# Patient Record
Sex: Male | Born: 1970
Health system: Southern US, Community
[De-identification: ages and names within clinical notes are randomized; demographics above are authoritative.]

## PROBLEM LIST (undated history)

## (undated) DIAGNOSIS — G4733 Obstructive sleep apnea (adult) (pediatric): Secondary | ICD-10-CM

## (undated) HISTORY — DX: Obstructive sleep apnea (adult) (pediatric): G47.33

## (undated) HISTORY — DX: Morbid (severe) obesity due to excess calories: E66.01

---

## 2009-02-22 ENCOUNTER — Ambulatory Visit (HOSPITAL_COMMUNITY): Admission: RE | Admit: 2009-02-22 | Discharge: 2009-02-23 | Payer: Self-pay | Admitting: Otolaryngology

## 2009-02-22 ENCOUNTER — Encounter (INDEPENDENT_AMBULATORY_CARE_PROVIDER_SITE_OTHER): Payer: Self-pay | Admitting: Otolaryngology

## 2010-11-20 IMAGING — CR DG CHEST 2V
2 series · 2 of 2 positions shown · non-contrast
Comparison: None

CLINICAL DATA: Chronic tonsillitis.  Preadmit chest x-ray.

CHEST - 2 VIEW

[view not recorded (1 of 2)]
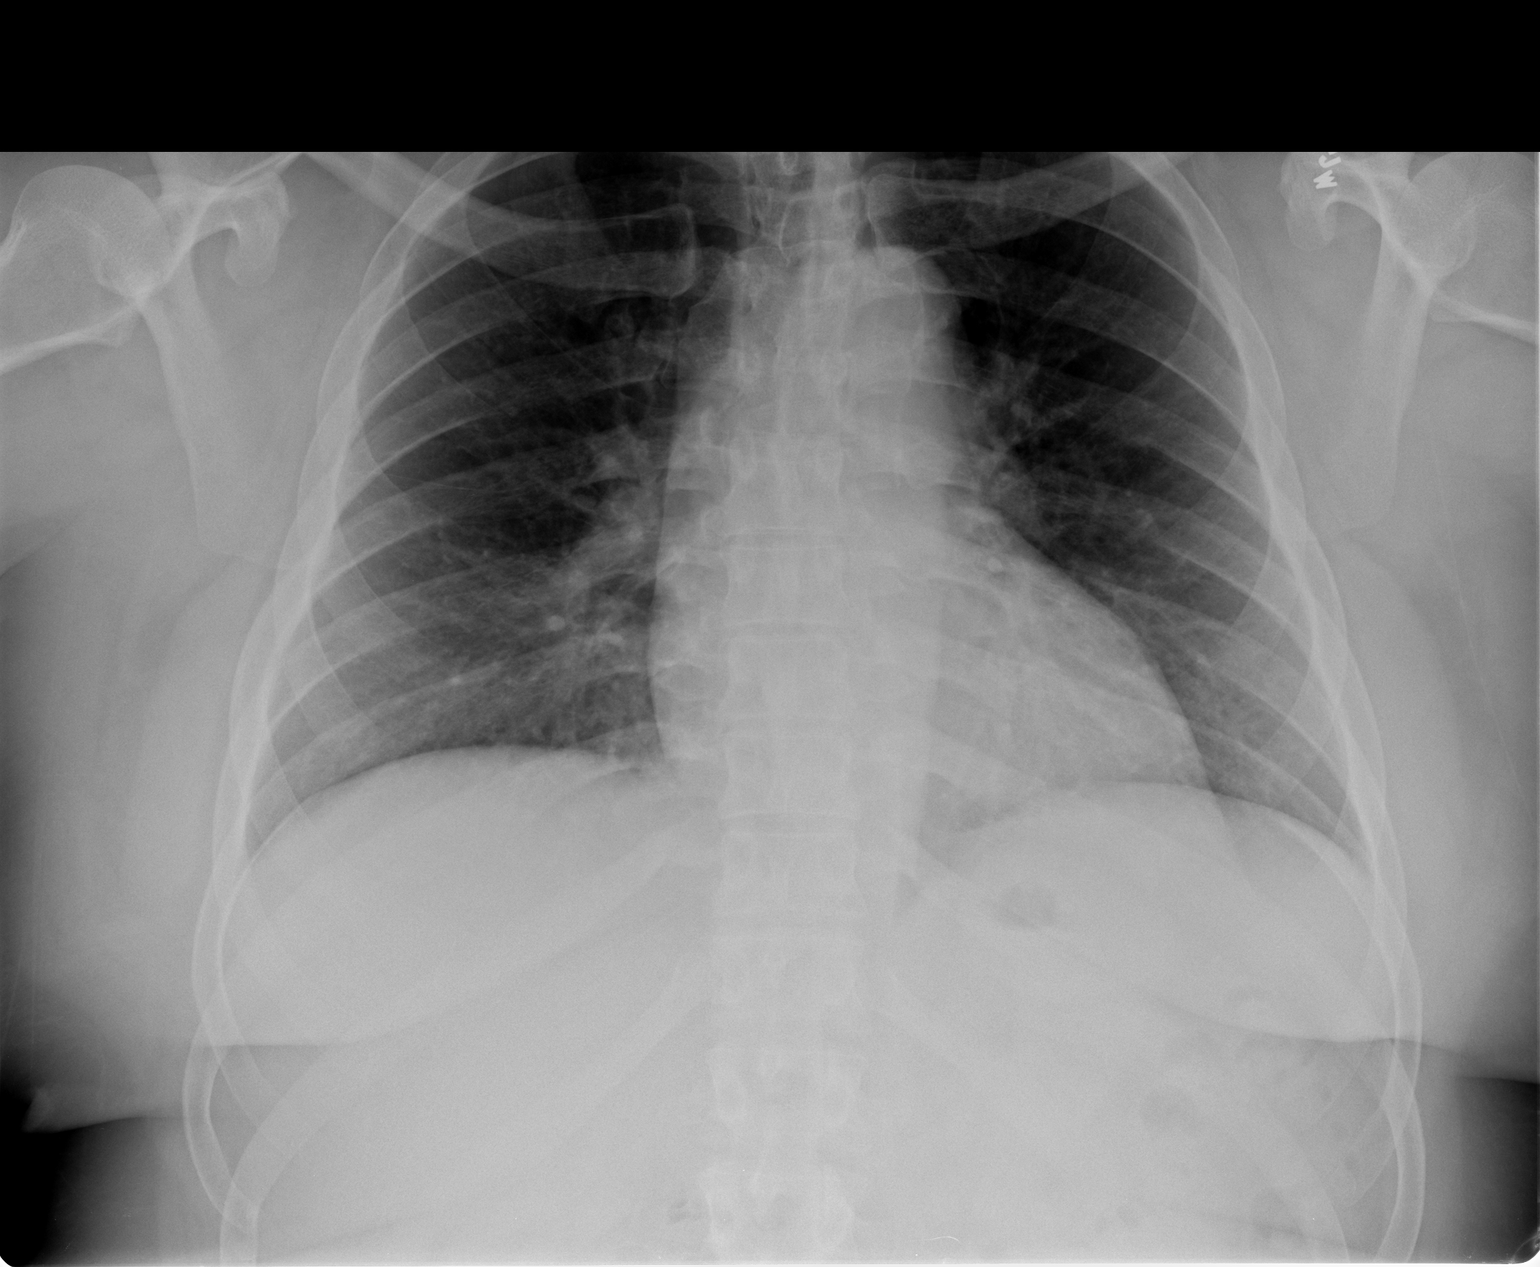

[view not recorded (2 of 2)]
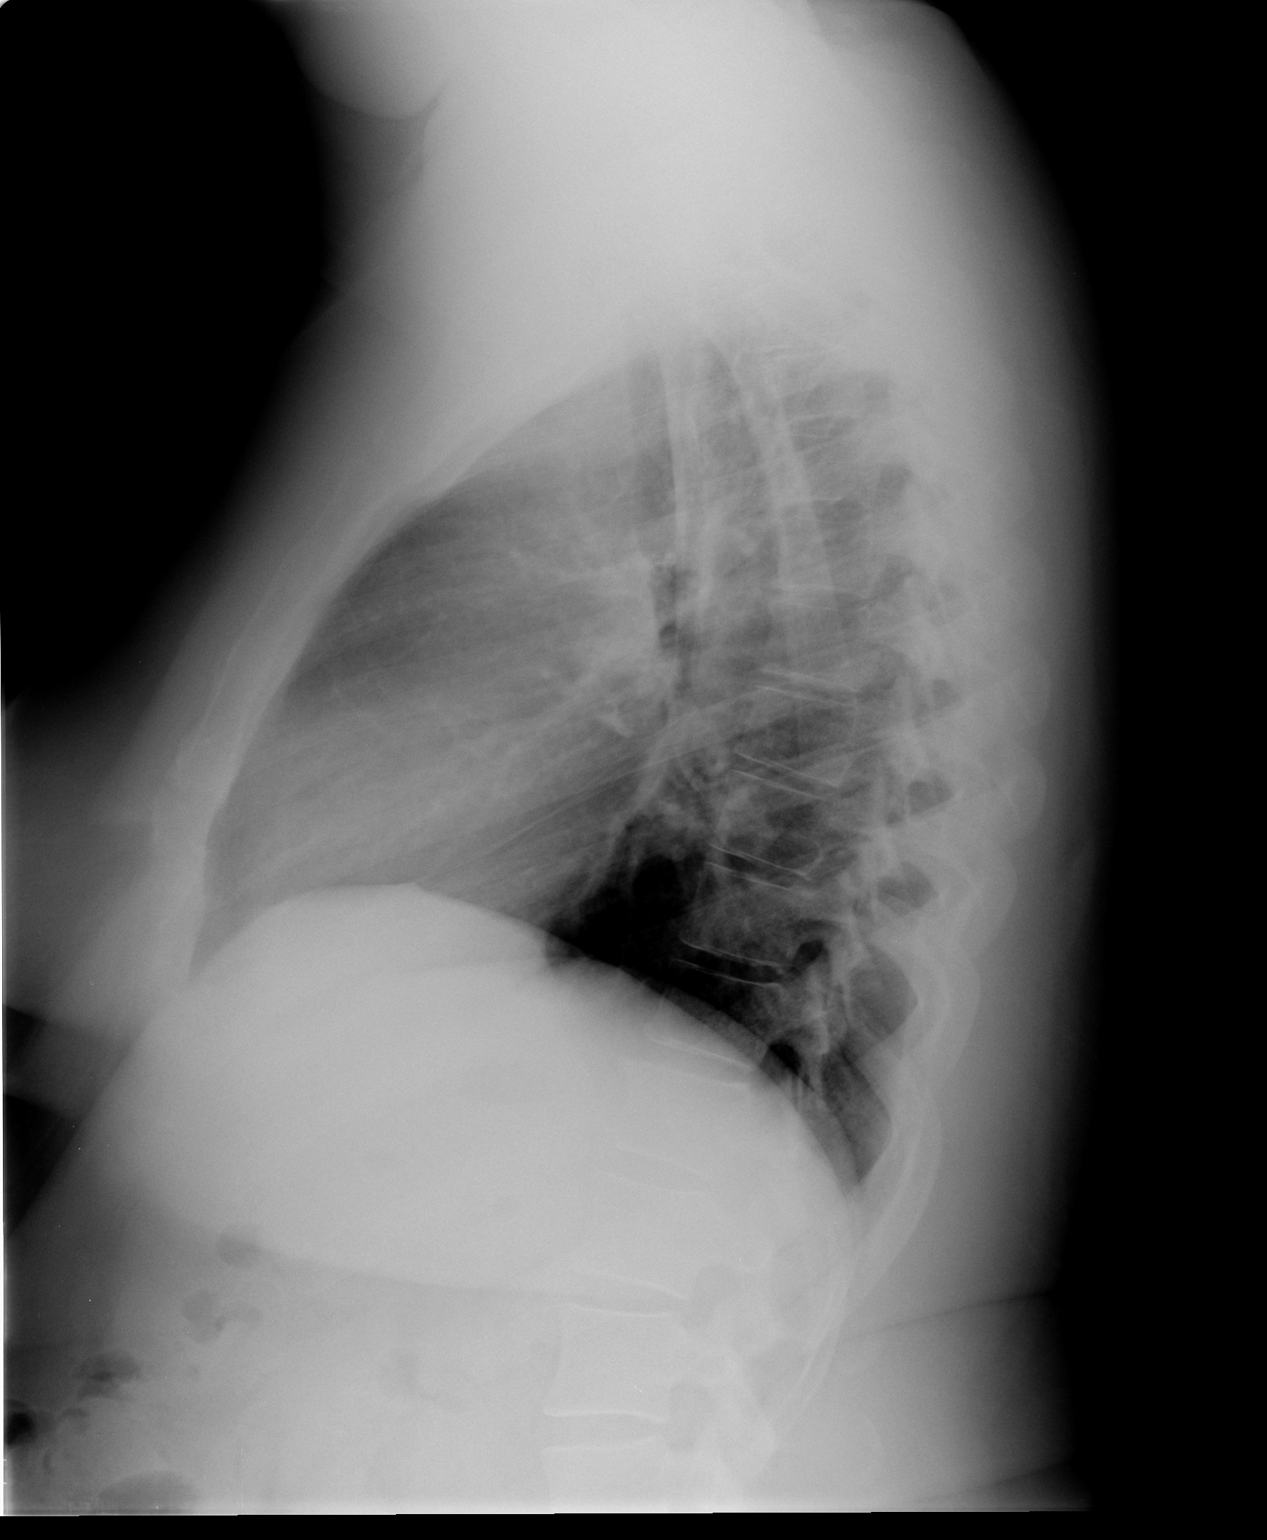

[2 of 2 positions shown; findings below may reference images not displayed]

FINDINGS: Heart size is normal and the vascularity is normal.  The
lungs are clear and  there is no infiltrate or effusion.  Negative
for mass lesion.
IMPRESSION: No active cardiopulmonary disease.

## 2011-01-25 ENCOUNTER — Inpatient Hospital Stay (INDEPENDENT_AMBULATORY_CARE_PROVIDER_SITE_OTHER)
Admission: RE | Admit: 2011-01-25 | Discharge: 2011-01-25 | Disposition: A | Discharge status: Home / Self Care | Payer: Medicare FFS | Source: Ambulatory Visit | Attending: Family Medicine | Admitting: Family Medicine

## 2011-01-25 DIAGNOSIS — I10 Essential (primary) hypertension: Secondary | ICD-10-CM

## 2011-01-25 DIAGNOSIS — J069 Acute upper respiratory infection, unspecified: Secondary | ICD-10-CM

## 2011-01-25 LAB — POCT RAPID STREP A (OFFICE): Streptococcus, Group A Screen (Direct): NEGATIVE

## 2011-03-28 LAB — HEPATIC FUNCTION PANEL
ALT: 16 U/L (ref 0–53)
AST: 20 U/L (ref 0–37)
Alkaline Phosphatase: 47 U/L (ref 39–117)
Total Protein: 6.9 g/dL (ref 6.0–8.3)

## 2011-03-28 LAB — BASIC METABOLIC PANEL
BUN: 7 mg/dL (ref 6–23)
Chloride: 105 mEq/L (ref 96–112)
Creatinine, Ser: 0.79 mg/dL (ref 0.4–1.5)
GFR calc Af Amer: >60 mL/min (ref >60)
GFR calc non Af Amer: >60 mL/min (ref >60)
Potassium: 4.2 mEq/L (ref 3.5–5.1)

## 2011-03-28 LAB — CBC
HCT: 42.8 % (ref 39.0–52.0)
MCV: 79.3 fL (ref 78.0–100.0)
Platelets: 194 10*3/uL (ref 150–400)
RBC: 5.4 MIL/uL (ref 4.22–5.81)
WBC: 5.5 10*3/uL (ref 4.0–10.5)

## 2011-04-30 NOTE — Op Note (Signed)
Ronnie Holt, Ronnie Holt                ACCOUNT NO.:  [id number]   MEDICAL RECORD NO.:  [id number]          PATIENT TYPE:  AMB   LOCATION:  SDS                          FACILITY:  [facility]   PHYSICIAN:  Antony Contras, MD     DATE OF BIRTH:  23-Jun-1971   DATE OF PROCEDURE:  DATE OF DISCHARGE:                               OPERATIVE REPORT   PREOPERATIVE DIAGNOSES:  1. Chronic tonsillitis.  2. Tonsillar hypertrophy.   POSTOPERATIVE DIAGNOSES:  1. Chronic tonsillitis.  2. Tonsillar hypertrophy.   PROCEDURE:  Tonsillectomy.   SURGEON:  Antony Contras, MD.   ANESTHESIA:  General endotracheal anesthesia.   COMPLICATIONS:  None.   INDICATION:  The patient is a 40 year old African American male with a 2-  month history of recurring tonsil infections, totaling 3 times.  His  throat gets clogged with infections and it is hard to breath.  He snores  badly, but has had no witnessed apnea.  He is found to have 4+ tonsils  and presents to the operating room for surgical management.   FINDINGS:  As above.   DESCRIPTION OF THE PROCEDURE:  The patient was identified in the holding  room and informed consent having been obtained including the discussion  of risks, benefits, and alternatives, the patient was brought to the  operative suite and put on the operative table in supine position.  Anesthesia was induced.  The patient was intubated by the anesthesia  team with fair degree of difficulty using a GlideScope.  The patient was  given intravenous antibiotics and steroids during the case.  The eyes  were taped closed and the bed was turned 90 degrees from anesthesia.  A  head wrap was placed around the patient's head and a Crowe-Davis  retractor was inserted in the mouth and opened to reveal the oropharynx.  The mouth was small and the retractor could not be opened very far.  The  retractor was placed in suspension on the Mayo stand.  There was a small  laceration on the right side at the  location where peritonsillar abscess  would be drained having been caused by the intubation.  The right tonsil  was grasped with a straight Allis and retracted medially while the  curvilinear incision was made along the anterior tonsillar pillar using  Bovie electrocautery in the setting of 20 connecting with the superior  pole laceration.  Dissection continued in the subcapsular plane until  the tonsil was removed.  Two tonsil packs were placed in the right  fossa.  The same procedure was then carried out on the left side.  Tonsils were passed separate for pathology.  Bleeding was then  controlled with suction cautery on a setting of 30.  After this, the  nose and throat were copiously irrigated with saline and a flexible  catheter was passed down the esophagus to suck out the stomach and  esophagus.  Retractor was then taken out of suspension and removed from the  patient's mouth.  He was returned back to anesthesia for wake up and was  extubated, and moved to  the recovery room in stable condition.   Of note, during the case, there were 2 episodes where the endotracheal  tube had to be suctioned of red, frothy fluid.      Antony Contras, MD  Electronically Signed     DDB/MEDQ  D:  02/22/2009  T:  02/23/2009  Job:  [id number]

## 2015-02-21 ENCOUNTER — Other Ambulatory Visit: Payer: Self-pay | Admitting: Gastroenterology

## 2017-03-10 DIAGNOSIS — Z0000 Encounter for general adult medical examination without abnormal findings: Secondary | ICD-10-CM | POA: Diagnosis not present

## 2017-03-13 DIAGNOSIS — E785 Hyperlipidemia, unspecified: Secondary | ICD-10-CM | POA: Diagnosis not present

## 2017-03-13 DIAGNOSIS — Z0000 Encounter for general adult medical examination without abnormal findings: Secondary | ICD-10-CM | POA: Diagnosis not present

## 2017-03-13 DIAGNOSIS — Z125 Encounter for screening for malignant neoplasm of prostate: Secondary | ICD-10-CM | POA: Diagnosis not present

## 2017-06-12 DIAGNOSIS — G4733 Obstructive sleep apnea (adult) (pediatric): Secondary | ICD-10-CM | POA: Diagnosis not present

## 2018-03-12 DIAGNOSIS — Z0000 Encounter for general adult medical examination without abnormal findings: Secondary | ICD-10-CM | POA: Diagnosis not present

## 2018-03-13 DIAGNOSIS — E785 Hyperlipidemia, unspecified: Secondary | ICD-10-CM | POA: Diagnosis not present

## 2018-03-13 DIAGNOSIS — Z125 Encounter for screening for malignant neoplasm of prostate: Secondary | ICD-10-CM | POA: Diagnosis not present

## 2018-03-26 DIAGNOSIS — S46912A Strain of unspecified muscle, fascia and tendon at shoulder and upper arm level, left arm, initial encounter: Secondary | ICD-10-CM | POA: Diagnosis not present

## 2018-06-09 DIAGNOSIS — Z86010 Personal history of colonic polyps: Secondary | ICD-10-CM | POA: Diagnosis not present

## 2018-06-09 DIAGNOSIS — D126 Benign neoplasm of colon, unspecified: Secondary | ICD-10-CM | POA: Diagnosis not present

## 2018-06-09 DIAGNOSIS — K5730 Diverticulosis of large intestine without perforation or abscess without bleeding: Secondary | ICD-10-CM | POA: Diagnosis not present

## 2018-06-24 DIAGNOSIS — G4733 Obstructive sleep apnea (adult) (pediatric): Secondary | ICD-10-CM | POA: Diagnosis not present

## 2019-03-29 DIAGNOSIS — G4733 Obstructive sleep apnea (adult) (pediatric): Secondary | ICD-10-CM | POA: Diagnosis not present

## 2020-02-28 ENCOUNTER — Ambulatory Visit: Payer: 59 | Attending: Internal Medicine

## 2020-02-28 DIAGNOSIS — Z20822 Contact with and (suspected) exposure to covid-19: Secondary | ICD-10-CM

## 2020-02-29 ENCOUNTER — Encounter: Payer: Self-pay | Admitting: *Deleted

## 2020-02-29 LAB — NOVEL CORONAVIRUS, NAA: SARS-CoV-2, NAA: DETECTED — AB

## 2020-03-02 ENCOUNTER — Ambulatory Visit (HOSPITAL_COMMUNITY)
Admission: RE | Admit: 2020-03-02 | Discharge: 2020-03-02 | Disposition: A | Discharge status: Home / Self Care | Payer: 59 | Source: Ambulatory Visit | Attending: Pulmonary Disease | Admitting: Pulmonary Disease

## 2020-03-02 ENCOUNTER — Encounter: Payer: Self-pay | Admitting: Physician Assistant

## 2020-03-02 ENCOUNTER — Other Ambulatory Visit: Payer: Self-pay | Admitting: Physician Assistant

## 2020-03-02 DIAGNOSIS — U071 COVID-19: Secondary | ICD-10-CM

## 2020-03-02 MED ORDER — SODIUM CHLORIDE 0.9 % IV SOLN
700.0000 mg | Freq: Once | INTRAVENOUS | Status: AC
  Administered 2020-03-02: 700 mg via INTRAVENOUS
  Filled 2020-03-02: qty 700

## 2020-03-02 MED ORDER — SODIUM CHLORIDE 0.9 % IV SOLN
INTRAVENOUS | Status: DC | PRN
  Administered 2020-03-02: 250 mL via INTRAVENOUS

## 2020-03-02 MED ORDER — EPINEPHRINE 0.3 MG/0.3ML IJ SOAJ: 0.3000 mg | Freq: Once | INTRAMUSCULAR | Status: DC | PRN

## 2020-03-02 MED ORDER — ALBUTEROL SULFATE HFA 108 (90 BASE) MCG/ACT IN AERS: 2.0000 | INHALATION_SPRAY | Freq: Once | RESPIRATORY_TRACT | Status: DC | PRN

## 2020-03-02 MED ORDER — FAMOTIDINE IN NACL 20-0.9 MG/50ML-% IV SOLN: 20.0000 mg | Freq: Once | INTRAVENOUS | Status: DC | PRN

## 2020-03-02 MED ORDER — METHYLPREDNISOLONE SODIUM SUCC 125 MG IJ SOLR: 125.0000 mg | Freq: Once | INTRAMUSCULAR | Status: DC | PRN

## 2020-03-02 MED ORDER — DIPHENHYDRAMINE HCL 50 MG/ML IJ SOLN: 50.0000 mg | Freq: Once | INTRAMUSCULAR | Status: DC | PRN

## 2020-03-02 NOTE — Discharge Instructions (Signed)

## 2020-03-02 NOTE — Progress Notes (Signed)
  Diagnosis: COVID-19  Physician:  Procedure: [facility] Med: bamlanivimab infusion - Provided patient with bamlanimivab fact sheet for patients, parents and caregivers prior to infusion.  Complications: No immediate complications noted.  Discharge: Discharged home   Rake, [place] 03/02/2020

## 2020-03-02 NOTE — Progress Notes (Signed)
  I connected by phone with Ronnie Holt on 03/02/2020 at 8:23 AM to discuss the potential use of an new treatment for mild to moderate COVID-19 viral infection in non-hospitalized patients.  This patient is a 49 y.o. male that meets the FDA criteria for Emergency Use Authorization of bamlanivimab or casirivimab\imdevimab.  Has a (+) direct SARS-CoV-2 viral test result  Has mild or moderate COVID-19   Is ? 49 years of age and weighs ? 40 kg  Is NOT hospitalized due to COVID-19  Is NOT requiring oxygen therapy or requiring an increase in baseline oxygen flow rate due to COVID-19  Is within 10 days of symptom onset  Has at least one of the high risk factor(s) for progression to severe COVID-19 and/or hospitalization as defined in EUA.  Specific high risk criteria : BMI >/= 35 and HTN   I have spoken and communicated the following to the patient or parent/caregiver:  1. FDA has authorized the emergency use of bamlanivimab and casirivimab\imdevimab for the treatment of mild to moderate COVID-19 in adults and pediatric patients with positive results of direct SARS-CoV-2 viral testing who are 41 years of age and older weighing at least 40 kg, and who are at high risk for progressing to severe COVID-19 and/or hospitalization.  2. The significant known and potential risks and benefits of bamlanivimab and casirivimab\imdevimab, and the extent to which such potential risks and benefits are unknown.  3. Information on available alternative treatments and the risks and benefits of those alternatives, including clinical trials.  4. Patients treated with bamlanivimab and casirivimab\imdevimab should continue to self-isolate and use infection control measures (e.g., wear mask, isolate, social distance, avoid sharing personal items, clean and disinfect "high touch" surfaces, and frequent handwashing) according to CDC guidelines.   5. The patient or parent/caregiver has the option to accept or refuse  bamlanivimab or casirivimab\imdevimab .  After reviewing this information with the patient, The patient agreed to proceed with receiving the bamlanimivab infusion and will be provided a copy of the Fact sheet prior to receiving the infusion.   Sx onset 3/12. Set up for mAB infusion today at 12:30. Directions given.  Angelena Form 03/02/2020 8:23 AM

## 2022-12-13 ENCOUNTER — Encounter: Payer: Self-pay | Admitting: Podiatry

## 2022-12-13 ENCOUNTER — Ambulatory Visit: Payer: 59 | Admitting: Podiatry

## 2022-12-13 DIAGNOSIS — M779 Enthesopathy, unspecified: Secondary | ICD-10-CM

## 2022-12-13 NOTE — Progress Notes (Signed)
Subjective:   Patient ID: Ronnie Holt, male   DOB: 51 y.o.   MRN: [id number]   HPI Patient states he has very flat feet and get tenderness with them and he had orthotics 7 years ago and needs a new pair made.  States that overall he is doing well does not smoke likes to be active   Review of Systems  All other systems reviewed and are negative.       Objective:  Physical Exam Vitals and nursing note reviewed.  Constitutional:      Appearance: He is well-developed.  Pulmonary:     Effort: Pulmonary effort is normal.  Musculoskeletal:        General: Normal range of motion.  Skin:    General: Skin is warm.  Neurological:     Mental Status: He is alert.     Neurovascular status intact muscle strength found to be adequate range of motion adequate patient found to have significant flatfoot deformity bilateral mild discomfort around the posterior tib insertion and no indications of loss of motion.  Patient has good digital perfusion well-oriented x 3     Assessment:  Tendinitis bilateral secondary to foot structure with orthotics which have done a good job at controlling symptoms     Plan:  H&P reviewed condition recommended long-term orthotics reviewed orthotic treatment with patient and patient at this point is casted for functional orthotics to lift the arches up.  Will be seen back when return all questions answered

## 2023-01-24 ENCOUNTER — Ambulatory Visit (INDEPENDENT_AMBULATORY_CARE_PROVIDER_SITE_OTHER): Payer: 59

## 2023-01-24 DIAGNOSIS — M779 Enthesopathy, unspecified: Secondary | ICD-10-CM

## 2023-01-24 NOTE — Progress Notes (Signed)
Patient presents today to pick up custom molded foot orthotics recommended by Dr. Paulla Dolly.   Orthotics were dispensed and fit was satisfactory. Reviewed instructions for break-in and wear. Written instructions given to patient.  Patient will follow up as needed.   [facility] - order # [id number]

## 2024-10-05 ENCOUNTER — Emergency Department (HOSPITAL_COMMUNITY)

## 2024-10-05 ENCOUNTER — Emergency Department (HOSPITAL_COMMUNITY)
Admission: EM | Admit: 2024-10-05 | Discharge: 2024-10-06 | Disposition: A | Attending: Emergency Medicine | Admitting: Emergency Medicine

## 2024-10-05 ENCOUNTER — Other Ambulatory Visit: Payer: Self-pay

## 2024-10-05 ENCOUNTER — Encounter (HOSPITAL_COMMUNITY): Payer: Self-pay

## 2024-10-05 DIAGNOSIS — Z79899 Other long term (current) drug therapy: Secondary | ICD-10-CM | POA: Insufficient documentation

## 2024-10-05 DIAGNOSIS — I10 Essential (primary) hypertension: Secondary | ICD-10-CM | POA: Insufficient documentation

## 2024-10-05 DIAGNOSIS — R109 Unspecified abdominal pain: Secondary | ICD-10-CM | POA: Diagnosis present

## 2024-10-05 DIAGNOSIS — N132 Hydronephrosis with renal and ureteral calculous obstruction: Secondary | ICD-10-CM | POA: Diagnosis not present

## 2024-10-05 DIAGNOSIS — N200 Calculus of kidney: Secondary | ICD-10-CM

## 2024-10-05 LAB — COMPREHENSIVE METABOLIC PANEL WITH GFR
ALT: 19 U/L (ref 0–44)
AST: 32 U/L (ref 15–41)
Albumin: 3.9 g/dL (ref 3.5–5.0)
Alkaline Phosphatase: 43 U/L (ref 38–126)
Anion gap: 11 (ref 5–15)
BUN: 16 mg/dL (ref 6–20)
CO2: 27 mmol/L (ref 22–32)
Calcium: 9.5 mg/dL (ref 8.9–10.3)
Chloride: 99 mmol/L (ref 98–111)
Creatinine, Ser: 1.56 mg/dL — ABNORMAL HIGH (ref 0.61–1.24)
GFR, Estimated: 53 mL/min — ABNORMAL LOW (ref >60)
Glucose, Bld: 106 mg/dL — ABNORMAL HIGH (ref 70–99)
Potassium: 3.2 mmol/L — ABNORMAL LOW (ref 3.5–5.1)
Sodium: 137 mmol/L (ref 135–145)
Total Bilirubin: 1.2 mg/dL (ref 0.0–1.2)
Total Protein: 7.3 g/dL (ref 6.5–8.1)

## 2024-10-05 LAB — CBC WITH DIFFERENTIAL/PLATELET
Abs Immature Granulocytes: 0.04 K/uL (ref 0.00–0.07)
Basophils Absolute: 0 K/uL (ref 0.0–0.1)
Basophils Relative: 0 %
Eosinophils Absolute: 0 K/uL (ref 0.0–0.5)
Eosinophils Relative: 0 %
HCT: 45.7 % (ref 39.0–52.0)
Hemoglobin: 14.5 g/dL (ref 13.0–17.0)
Immature Granulocytes: 0 %
Lymphocytes Relative: 14 %
Lymphs Abs: 1.3 K/uL (ref 0.7–4.0)
MCH: 24.5 pg — ABNORMAL LOW (ref 26.0–34.0)
MCHC: 31.7 g/dL (ref 30.0–36.0)
MCV: 77.1 fL — ABNORMAL LOW (ref 80.0–100.0)
Monocytes Absolute: 0.8 K/uL (ref 0.1–1.0)
Monocytes Relative: 8 %
Neutro Abs: 7 K/uL (ref 1.7–7.7)
Neutrophils Relative %: 78 %
Platelets: 206 K/uL (ref 150–400)
RBC: 5.93 MIL/uL — ABNORMAL HIGH (ref 4.22–5.81)
RDW: 15.3 % (ref 11.5–15.5)
WBC: 9.1 K/uL (ref 4.0–10.5)
nRBC: 0 % (ref 0.0–0.2)

## 2024-10-05 LAB — LIPASE, BLOOD: Lipase: 31 U/L (ref 11–51)

## 2024-10-05 LAB — TROPONIN I (HIGH SENSITIVITY)
Troponin I (High Sensitivity): 12 ng/L (ref <18)
Troponin I (High Sensitivity): 10 ng/L (ref <18)

## 2024-10-05 NOTE — ED Notes (Signed)
Transported to CT from triage

## 2024-10-05 NOTE — ED Provider Triage Note (Signed)
 Emergency Medicine Provider Triage Evaluation Note  Ronnie Holt , a 53 y.o. male  was evaluated in triage.  Pt complains of intermittent abdominal pain that started this morning about 4 AM.  Reports this feels like a abdominal cramping and pain will go to a 10 out of 10 when most severe.  This is also associated with some feelings of nausea.  No vomiting.  No fever or chills.  He does report some chest pain associated with this as well.  No shortness of breath.  Denies any dysuria, hematuria, or increased frequency.  Review of Systems  Positive: As above Negative: As above  Physical Exam  BP (!) 176/96 (BP Location: Right Arm)   Pulse 71   Temp 98.6 F (37 C) (Oral)   Resp 18   Ht 5' 10 (1.778 m)   Wt 113.4 kg   SpO2 96%   BMI 35.87 kg/m  Gen:   Awake, no distress   Resp:  Normal effort  MSK:   Moves extremities without difficulty    Medical Decision Making  Medically screening exam initiated at 7:51 PM.  Appropriate orders placed.  Ronnie Holt was informed that the remainder of the evaluation will be completed by another provider, this initial triage assessment does not replace that evaluation, and the importance of remaining in the ED until their evaluation is complete.     Veta Palma, PA-C 10/05/24 1951

## 2024-10-05 NOTE — ED Triage Notes (Signed)
 Woke up ~4am with severe right sided abdominal pain that radiates to back. Unable to differentiate upper from lower but seems in the middle.

## 2024-10-05 NOTE — ED Triage Notes (Signed)
 Patient states he started having abdominal pain at 4am. He is nauseous but has not vomited or had diarrhea. He is diaphoretic. Denies chest pain.

## 2024-10-06 LAB — URINALYSIS, ROUTINE W REFLEX MICROSCOPIC
Bilirubin Urine: NEGATIVE
Glucose, UA: NEGATIVE mg/dL
Hgb urine dipstick: NEGATIVE
Ketones, ur: NEGATIVE mg/dL
Leukocytes,Ua: NEGATIVE
Nitrite: NEGATIVE
Protein, ur: NEGATIVE mg/dL
Specific Gravity, Urine: 1.023 (ref 1.005–1.030)
pH: 5 (ref 5.0–8.0)

## 2024-10-06 MED ORDER — LACTATED RINGERS IV BOLUS
1000.0000 mL | Freq: Once | INTRAVENOUS | Status: AC
Start: 2024-10-06 — End: 2024-10-06
  Administered 2024-10-06: 1000 mL via INTRAVENOUS

## 2024-10-06 MED ORDER — ONDANSETRON HCL 4 MG/2ML IJ SOLN
4.0000 mg | Freq: Once | INTRAMUSCULAR | Status: AC
  Administered 2024-10-06: 4 mg via INTRAVENOUS
  Filled 2024-10-06: qty 2

## 2024-10-06 MED ORDER — NAPROXEN 500 MG PO TABS: 500.0000 mg | ORAL_TABLET | Freq: Two times a day (BID) | ORAL | 0 refills | Status: AC | PRN

## 2024-10-06 MED ORDER — OXYCODONE-ACETAMINOPHEN 5-325 MG PO TABS: 2.0000 | ORAL_TABLET | ORAL | 0 refills | Status: DC | PRN

## 2024-10-06 MED ORDER — OXYCODONE-ACETAMINOPHEN 5-325 MG PO TABS: 2.0000 | ORAL_TABLET | ORAL | 0 refills | Status: AC | PRN

## 2024-10-06 MED ORDER — FENTANYL CITRATE (PF) 50 MCG/ML IJ SOSY
50.0000 ug | PREFILLED_SYRINGE | Freq: Once | INTRAMUSCULAR | Status: AC
  Administered 2024-10-06: 50 ug via INTRAVENOUS
  Filled 2024-10-06: qty 1

## 2024-10-06 MED ORDER — ONDANSETRON 4 MG PO TBDP: 4.0000 mg | ORAL_TABLET | Freq: Three times a day (TID) | ORAL | 0 refills | Status: AC | PRN

## 2024-10-06 MED ORDER — TAMSULOSIN HCL 0.4 MG PO CAPS: 0.4000 mg | ORAL_CAPSULE | Freq: Every day | ORAL | 0 refills | Status: AC

## 2024-10-06 MED ORDER — KETOROLAC TROMETHAMINE 30 MG/ML IJ SOLN
15.0000 mg | Freq: Once | INTRAMUSCULAR | Status: AC
  Administered 2024-10-06: 15 mg via INTRAVENOUS
  Filled 2024-10-06: qty 1

## 2024-10-06 MED ORDER — TAMSULOSIN HCL 0.4 MG PO CAPS
0.4000 mg | ORAL_CAPSULE | Freq: Once | ORAL | Status: AC
  Administered 2024-10-06: 0.4 mg via ORAL
  Filled 2024-10-06: qty 1

## 2024-10-06 NOTE — ED Provider Notes (Signed)
 [place] AT [facility] Provider Note   CSN: [id number] Arrival date & time: 10/05/24  1805     Patient presents with: Abdominal Pain   Ronnie Holt is a 53 y.o. male.   presenting with right-sided abdominal pain that began this morning, waking him from sleep. The pain was severe, described as comparable to labor pain, and was most intense between 4 and 6 AM. The patient reports the pain as intermittent and has never experienced similar symptoms before. He denies fever, nausea, vomiting, diarrhea, or constipation, though he attempted to vomit and have a bowel movement without success. The patient took Pepto, Mylanta, peppermint, and ginger at home to alleviate symptoms. He has a history of hypertension and hyperlipidemia, for which he takes benazepril, hydrochlorothiazide, amlodipine, and a statin. There is no history of prior kidney stones. The patient denies any recent changes in his water intake. History was obtained from both the patient and his wife.   Abdominal Pain      Prior to Admission medications   Medication Sig Start Date End Date Taking? Authorizing Provider  naproxen (NAPROSYN) 500 MG tablet Take 1 tablet (500 mg total) by mouth 2 (two) times daily as needed for moderate pain (pain score 4-6). 10/06/24  Yes Terrian Sentell, Selinda, MD  ondansetron (ZOFRAN-ODT) 4 MG disintegrating tablet Take 1 tablet (4 mg total) by mouth every 8 (eight) hours as needed for vomiting. 10/06/24  Yes Veneta Sliter, Selinda, MD  tamsulosin (FLOMAX) 0.4 MG CAPS capsule Take 1 capsule (0.4 mg total) by mouth daily. Until stone passes 10/06/24  Yes Brandan Robicheaux, Selinda, MD  benazepril-hydrochlorthiazide (LOTENSIN HCT) 20-25 MG per tablet Take 1 tablet by mouth daily.    [provider]  oxyCODONE-acetaminophen (PERCOCET) 5-325 MG tablet Take 2 tablets by mouth every 4 (four) hours as needed. 10/06/24   Raeli Wiens, MD  pravastatin (PRAVACHOL) 20 MG tablet Take 20 mg by mouth  daily.    [provider]    Allergies: Patient has no known allergies.    Review of Systems  Gastrointestinal:  Positive for abdominal pain.    Updated Vital Signs BP (!) 150/91   Pulse 63   Temp 98 F (36.7 C) (Oral)   Resp 16   Ht 5' 10 (1.778 m)   Wt 113.4 kg   SpO2 95%   BMI 35.87 kg/m   Physical Exam Vitals and nursing note reviewed.  Constitutional:      Appearance: He is well-developed.  HENT:     Head: Normocephalic and atraumatic.  Cardiovascular:     Rate and Rhythm: Normal rate.  Pulmonary:     Effort: Pulmonary effort is normal. No respiratory distress.  Abdominal:     General: There is no distension.  Musculoskeletal:        General: Normal range of motion.     Cervical back: Normal range of motion.  Neurological:     Mental Status: He is alert.     (all labs ordered are listed, but only abnormal results are displayed) Labs Reviewed  COMPREHENSIVE METABOLIC PANEL WITH GFR - Abnormal; Notable for the following components:      Result Value   Potassium 3.2 (*)    Glucose, Bld 106 (*)    Creatinine, Ser 1.56 (*)    GFR, Estimated 53 (*)    All other components within normal limits  CBC WITH DIFFERENTIAL/PLATELET - Abnormal; Notable for the following components:   RBC 5.93 (*)    MCV  77.1 (*)    MCH 24.5 (*)    All other components within normal limits  LIPASE, BLOOD  URINALYSIS, ROUTINE W REFLEX MICROSCOPIC  TROPONIN I (HIGH SENSITIVITY)  TROPONIN I (HIGH SENSITIVITY)    EKG: None  Radiology: DG Chest 2 View Result Date: 10/05/2024 CLINICAL DATA:  Chest pain EXAM: CHEST - 2 VIEW COMPARISON:  02/20/2009 FINDINGS: The heart size and mediastinal contours are within normal limits. Both lungs are clear. The visualized skeletal structures are unremarkable. IMPRESSION: No active cardiopulmonary disease. Electronically Signed   By: Luke Bun M.D.   On: 10/05/2024 20:18   CT Renal Stone Study Result Date: 10/05/2024 CLINICAL DATA:   Flank pain EXAM: CT ABDOMEN AND PELVIS WITHOUT CONTRAST TECHNIQUE: Multidetector CT imaging of the abdomen and pelvis was performed following the standard protocol without IV contrast. RADIATION DOSE REDUCTION: This exam was performed according to the departmental dose-optimization program which includes automated exposure control, adjustment of the mA and/or kV according to patient size and/or use of iterative reconstruction technique. COMPARISON:  None Available. FINDINGS: Lower chest: Lung bases are clear. Hepatobiliary: Subcentimeter hypodensity in the right hepatic lobe too small to further characterize. No calcified gallstone or biliary dilatation Pancreas: Unremarkable. No pancreatic ductal dilatation or surrounding inflammatory changes. Spleen: Normal in size without focal abnormality. Adrenals/Urinary Tract: Adrenal glands are normal. Cyst upper pole right kidney, no imaging follow-up is recommended. Moderate right perinephric stranding. Mild right hydronephrosis and hydroureter with Peri ureteral stranding. 3 mm stone at the right posterior bladder. Decompressed thick-walled bladder Stomach/Bowel: Stomach is within normal limits. Appendix appears normal. No evidence of bowel wall thickening, distention, or inflammatory changes. Mild diverticular disease of the left colon Vascular/Lymphatic: Aortic atherosclerosis. No enlarged abdominal or pelvic lymph nodes. Reproductive: Prostate is unremarkable. Other: Negative for pelvic effusion or free air. Small fat containing umbilical hernia Musculoskeletal: No acute or significant osseous findings. IMPRESSION: 1. Mild right hydronephrosis and hydroureter with perinephric and periureteral stranding. 3 mm stone at the right posterior bladder, either represents recently passed kidney stone or impending passage of kidney stone. 2. Aortic atherosclerosis. Aortic Atherosclerosis (ICD10-I70.0). Electronically Signed   By: Luke Bun M.D.   On: 10/05/2024 20:17      Procedures   Medications Ordered in the ED  lactated ringers bolus 1,000 mL (0 mLs Intravenous Stopped 10/06/24 0236)  ondansetron (ZOFRAN) injection 4 mg (4 mg Intravenous Given 10/06/24 0037)  ketorolac (TORADOL) 30 MG/ML injection 15 mg (15 mg Intravenous Given 10/06/24 0037)  fentaNYL (SUBLIMAZE) injection 50 mcg (50 mcg Intravenous Given 10/06/24 0038)  tamsulosin (FLOMAX) capsule 0.4 mg (0.4 mg Oral Given 10/06/24 0037)                                    Medical Decision Making Amount and/or Complexity of Data Reviewed Labs: ordered.  Risk Prescription drug management.   The patient presented to the emergency department with severe, intermittent right-sided abdominal pain, which was described as comparable to labor pain. The pain was initially diffuse but had significantly subsided by the time of evaluation. The patient reported no prior episodes of similar pain and denied fever, nausea, vomiting, diarrhea, or constipation. The patient attempted self-medication with Pepto-Bismol, Mylanta, peppermint, and ginger without significant relief. A CT scan revealed a kidney stone, likely already passed into the bladder, causing irritation to the ureter and kidney. The patient has a history of hypertension and hyperlipidemia, managed with  benazepril, hydrochlorothiazide, amlodipine, and a statin. The kidney function was noted to be slightly abnormal, likely due to the stone. The patient was administered IV fluids, pain medication, and anti-nausea medication. The urine sample was misplaced, necessitating a repeat collection to rule out infection. The patient was educated on kidney stone prevention and discharged with follow-up instructions.  Differential Diagnosis: Differential diagnosis includes but is not limited to: nephrolithiasis, urinary tract infection, pyelonephritis, gastrointestinal colic.  Diagnostics Review  Laboratory Interpretation (Interpreted by me): Kidney function  tests show slight abnormality, likely due to nephrolithiasis. Urine analysis pending.  Imaging Interpretation (Interpreted by me): CT scan shows a kidney stone likely passed into the bladder, with no additional stones in the kidneys.  Historians other than the patient: The patient's spouse provided additional context regarding the onset and severity of the pain.  Care significantly affected by the following chronic Conditions: Hypertension and hyperlipidemia, managed with benazepril, hydrochlorothiazide, amlodipine, and a statin, may contribute to kidney stone formation.  Management  Medications: IV fluids, pain medication, and anti-nausea medication administered.  Re-Evaluations/Course of Care: The patient's pain significantly improved after treatment, and no new stones were identified on imaging.  Final diagnoses:  Kidney stone    ED Discharge Orders          Ordered    oxyCODONE-acetaminophen (PERCOCET) 5-325 MG tablet  Every 4 hours PRN,   Status:  Discontinued        10/06/24 0211    naproxen (NAPROSYN) 500 MG tablet  2 times daily PRN        10/06/24 0211    tamsulosin (FLOMAX) 0.4 MG CAPS capsule  Daily        10/06/24 0211    ondansetron (ZOFRAN-ODT) 4 MG disintegrating tablet  Every 8 hours PRN        10/06/24 0211    oxyCODONE-acetaminophen (PERCOCET) 5-325 MG tablet  Every 4 hours PRN        10/06/24 0213               Avree Szczygiel, MD 10/06/24 [id number]
